# Patient Record
Sex: Female | Born: 1960 | Race: Black or African American | Hispanic: No | Marital: Married
Health system: Southern US, Community
[De-identification: ages and names within clinical notes are randomized; demographics above are authoritative.]

---

## 2001-11-23 ENCOUNTER — Emergency Department (HOSPITAL_COMMUNITY): Admission: EM | Admit: 2001-11-23 | Discharge: 2001-11-23 | Payer: Self-pay | Admitting: Emergency Medicine

## 2003-08-11 ENCOUNTER — Ambulatory Visit (HOSPITAL_COMMUNITY): Admission: RE | Admit: 2003-08-11 | Discharge: 2003-08-11 | Payer: Self-pay | Admitting: General Surgery

## 2003-08-24 ENCOUNTER — Emergency Department (HOSPITAL_COMMUNITY): Admission: EM | Admit: 2003-08-24 | Discharge: 2003-08-24 | Payer: Self-pay | Admitting: *Deleted

## 2003-10-20 ENCOUNTER — Emergency Department (HOSPITAL_COMMUNITY): Admission: EM | Admit: 2003-10-20 | Discharge: 2003-10-20 | Payer: Self-pay | Admitting: Emergency Medicine

## 2006-01-27 ENCOUNTER — Ambulatory Visit: Payer: Self-pay | Admitting: Cardiology

## 2006-01-27 ENCOUNTER — Inpatient Hospital Stay (HOSPITAL_COMMUNITY): Admission: EM | Admit: 2006-01-27 | Discharge: 2006-01-30 | Payer: Self-pay | Admitting: Emergency Medicine

## 2006-02-07 ENCOUNTER — Emergency Department (HOSPITAL_COMMUNITY): Admission: EM | Admit: 2006-02-07 | Discharge: 2006-02-07 | Payer: Self-pay | Admitting: Emergency Medicine

## 2006-03-11 ENCOUNTER — Inpatient Hospital Stay (HOSPITAL_COMMUNITY): Admission: EM | Admit: 2006-03-11 | Discharge: 2006-03-17 | Payer: Self-pay | Admitting: Emergency Medicine

## 2006-03-11 ENCOUNTER — Ambulatory Visit: Payer: Self-pay | Admitting: Cardiology

## 2006-04-04 ENCOUNTER — Emergency Department (HOSPITAL_COMMUNITY): Admission: EM | Admit: 2006-04-04 | Discharge: 2006-04-04 | Payer: Self-pay | Admitting: Emergency Medicine

## 2006-04-05 ENCOUNTER — Ambulatory Visit: Payer: Self-pay | Admitting: Internal Medicine

## 2006-04-05 ENCOUNTER — Inpatient Hospital Stay (HOSPITAL_COMMUNITY): Admission: EM | Admit: 2006-04-05 | Discharge: 2006-04-15 | Payer: Self-pay | Admitting: Emergency Medicine

## 2006-04-05 ENCOUNTER — Emergency Department (HOSPITAL_COMMUNITY): Admission: EM | Admit: 2006-04-05 | Discharge: 2006-04-05 | Payer: Self-pay | Admitting: Emergency Medicine

## 2006-04-07 ENCOUNTER — Ambulatory Visit: Payer: Self-pay | Admitting: Cardiology

## 2006-05-08 ENCOUNTER — Ambulatory Visit: Payer: Self-pay | Admitting: Cardiology

## 2006-05-09 ENCOUNTER — Ambulatory Visit: Payer: Self-pay | Admitting: Internal Medicine

## 2006-05-09 ENCOUNTER — Ambulatory Visit: Payer: Self-pay | Admitting: Endocrinology

## 2006-05-09 ENCOUNTER — Inpatient Hospital Stay (HOSPITAL_COMMUNITY): Admission: EM | Admit: 2006-05-09 | Discharge: 2006-05-26 | Payer: Self-pay | Admitting: Cardiology

## 2006-05-11 ENCOUNTER — Ambulatory Visit: Payer: Self-pay | Admitting: Cardiology

## 2006-05-13 DIAGNOSIS — Z8669 Personal history of other diseases of the nervous system and sense organs: Secondary | ICD-10-CM

## 2006-05-14 DIAGNOSIS — L738 Other specified follicular disorders: Secondary | ICD-10-CM

## 2006-05-16 ENCOUNTER — Ambulatory Visit: Payer: Self-pay | Admitting: Infectious Diseases

## 2006-05-26 ENCOUNTER — Encounter (INDEPENDENT_AMBULATORY_CARE_PROVIDER_SITE_OTHER): Payer: Self-pay | Admitting: *Deleted

## 2006-05-26 DIAGNOSIS — I10 Essential (primary) hypertension: Secondary | ICD-10-CM | POA: Insufficient documentation

## 2006-05-26 DIAGNOSIS — M329 Systemic lupus erythematosus, unspecified: Secondary | ICD-10-CM | POA: Insufficient documentation

## 2006-08-17 ENCOUNTER — Ambulatory Visit: Payer: Self-pay | Admitting: Cardiology

## 2007-05-17 ENCOUNTER — Ambulatory Visit: Payer: Self-pay | Admitting: Cardiology

## 2007-10-08 IMAGING — US US CAROTID DUPLEX BILAT
1 series · 13 of 24 positions shown · non-contrast
Comparison: No prior studies for comparison.

CLINICAL DATA: Bilateral cerebral infarcts.
 BILATERAL CAROTID DUPLEX DOPPLER ULTRASOUND:

[Series 1: unknown · 0.07mm/px · 13 of 38 slices shown]
[im 1/38]
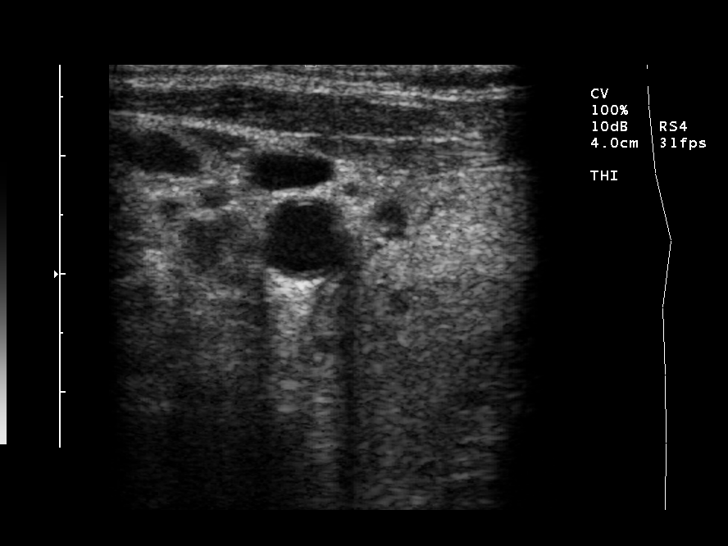
[im 4/38]
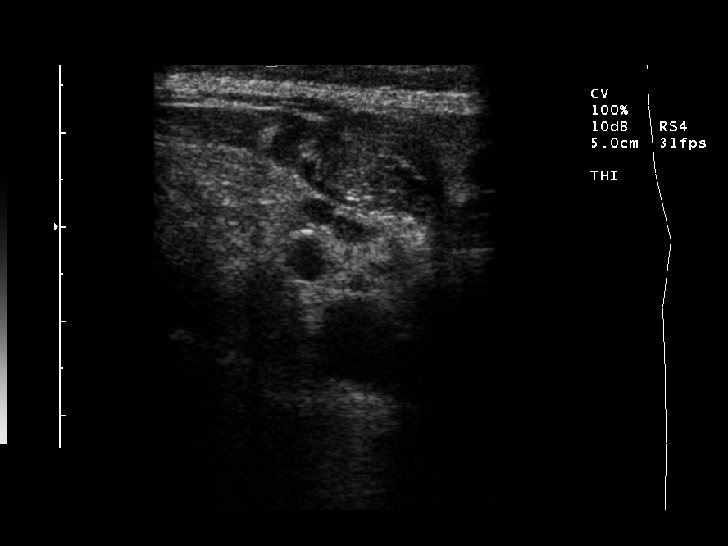
[im 7/38]
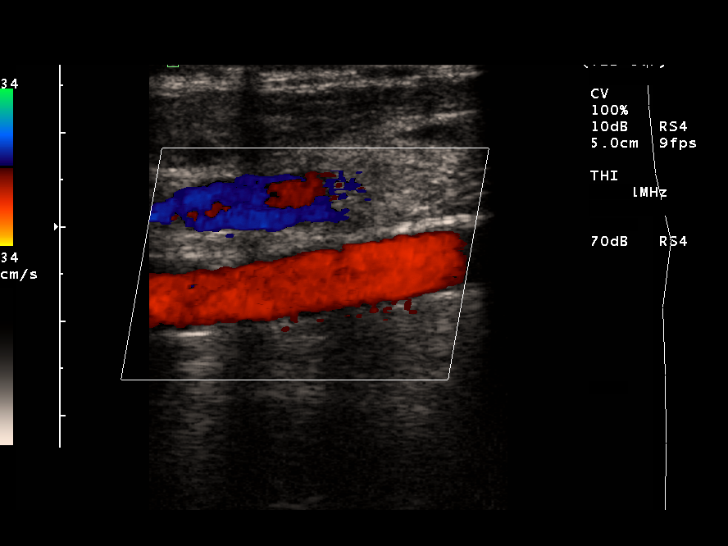
[im 10/38]
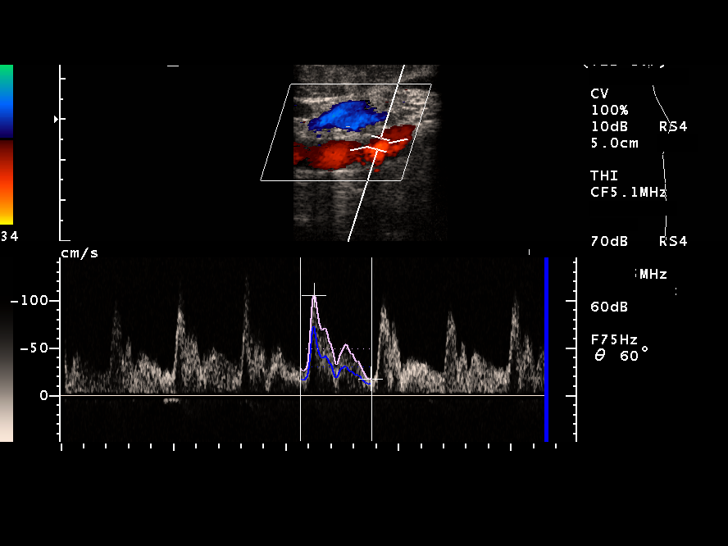
[im 13/38]
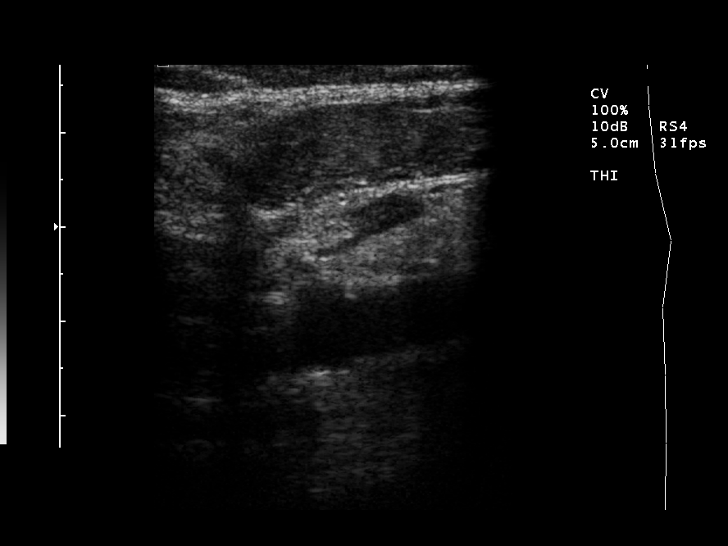
[im 17/38]
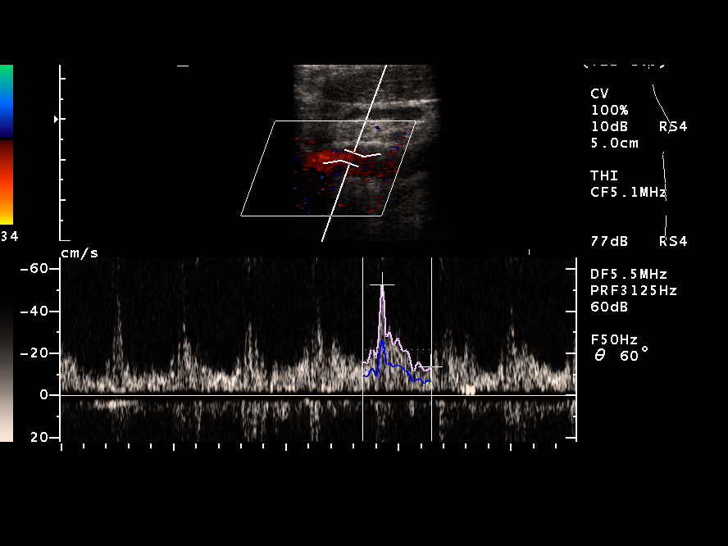
[im 20/38]
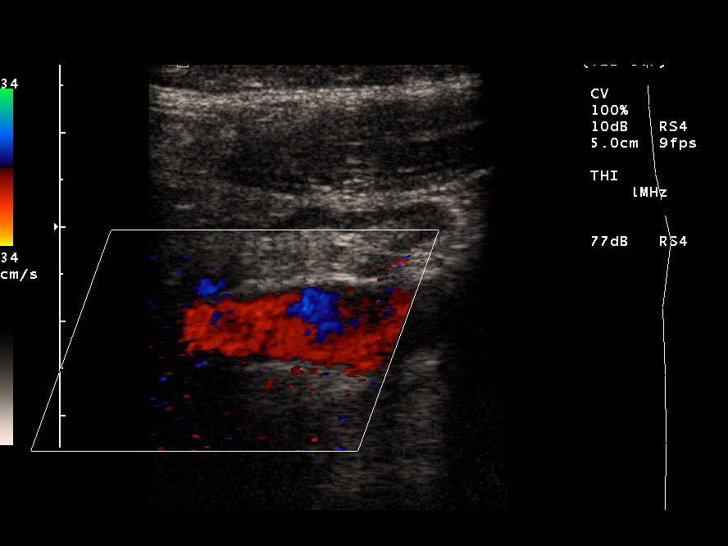
[im 21/38]
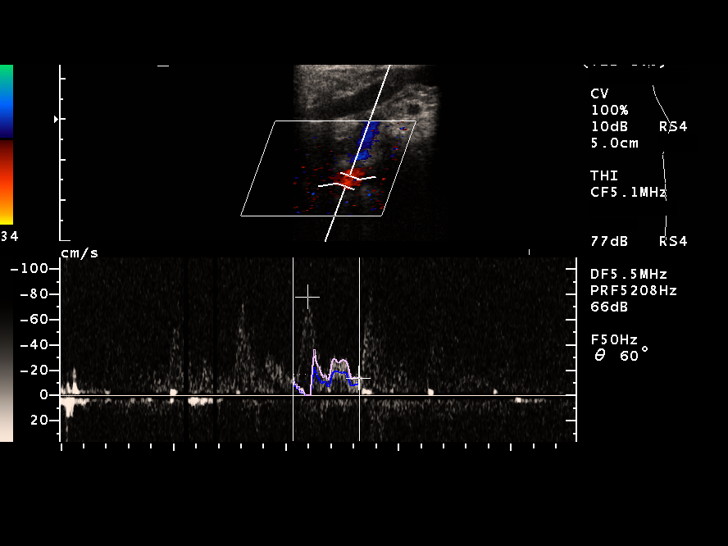
[im 25/38]
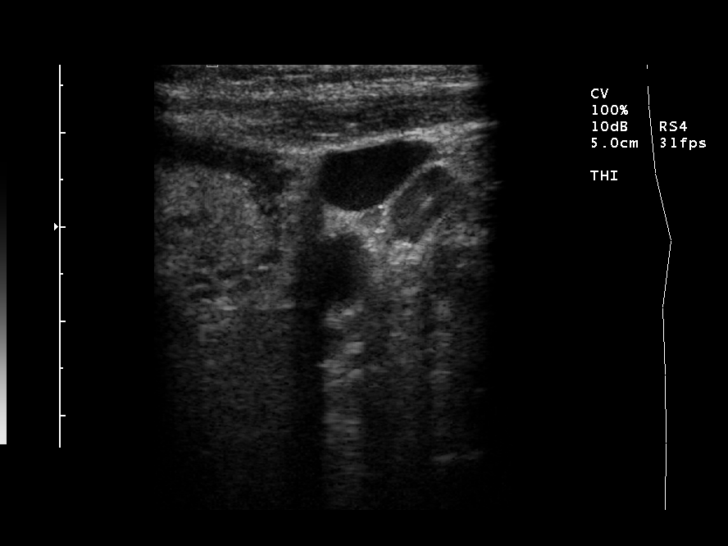
[im 28/38]
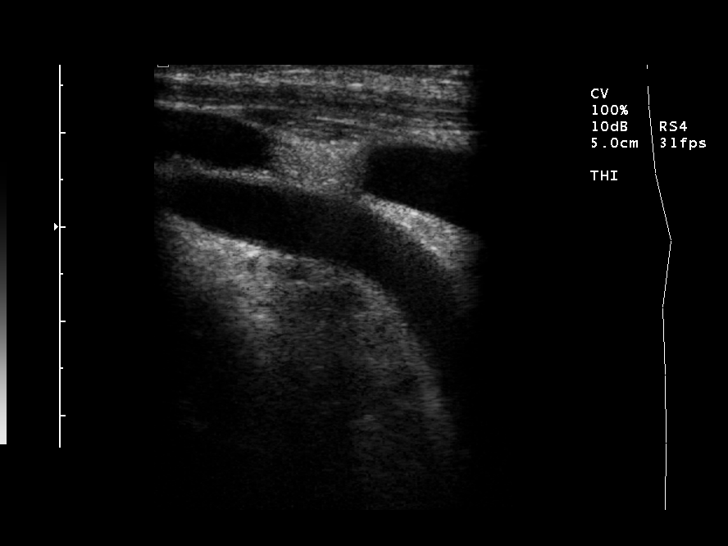
[im 31/38]
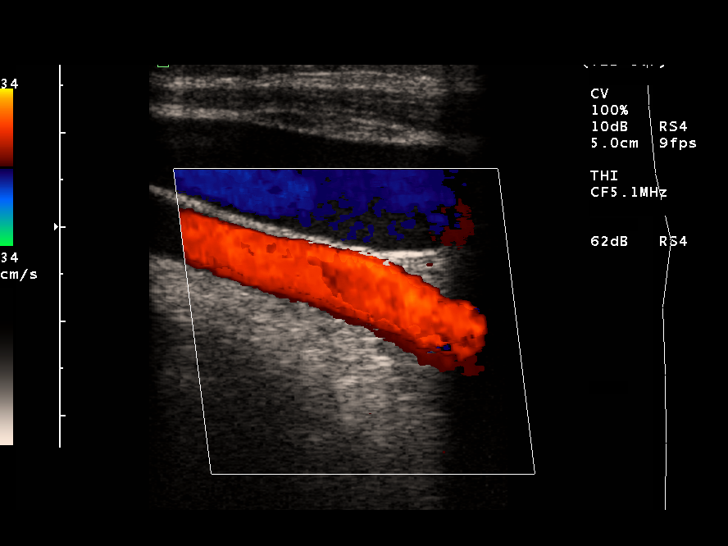
[im 34/38]
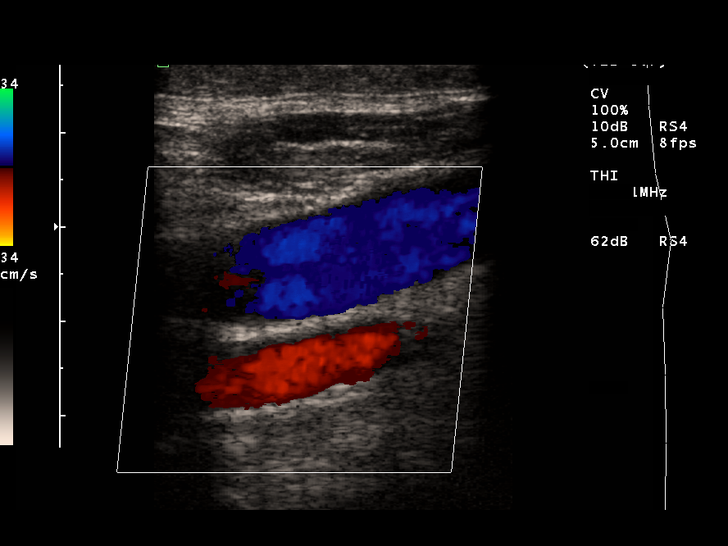
[im 38/38]
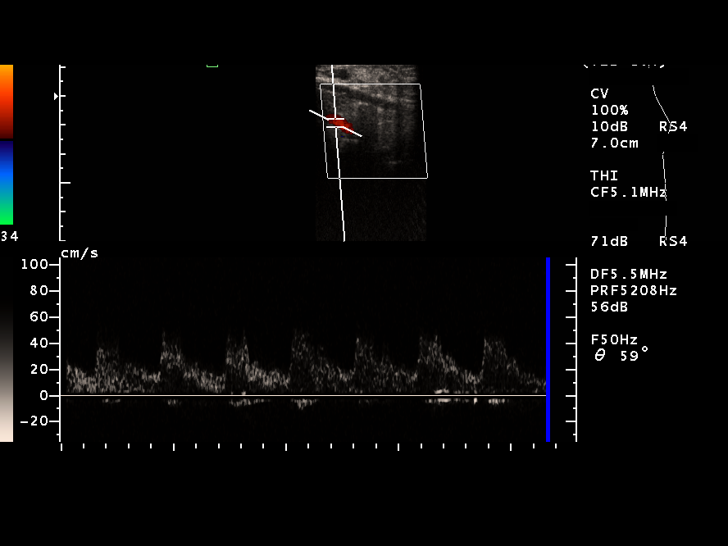

[13 of 24 positions shown; findings below may reference images not displayed]

FINDINGS: The following Doppler flow velocity measurements were obtained (in cm/sec):
 SITE:  PEAK SYSTOLIC  END DIASTOLIC
 RIGHT  ICA:
 RIGHT ECA:
 RIGHT CCA:
 RIGHT ICA/CCA RATIO:

 LEFT ICA:
 LEFT ECA:  
 LEFT CCA:
 LEFT ICA/CCA RATIO:  
 Criteria:  Quantification of carotid stenosis is based on velocity parameters that correlate the residual internal carotid diameter with NASCET-based stenosis levels.
 The right carotid bifurcation shows no significant plaque.  Waveforms and velocities obtained at the level of the common carotid, external carotid, and internal carotid arteries are normal.  Antegrade flow is present in the right vertebral artery.
 The left common carotid artery was sampled demonstrating normal velocity and waveform.  Due to the patient?s body habitus as well as inability to cooperate for the examination, the left internal carotid or external carotid arteries could not be visualized or sampled.  Antegrade flow is present in the left vertebral artery.
IMPRESSION: 1.  No significant right carotid disease in the neck with estimated ICA stenosis of less than 50%.
 2.  Inability to sample or visualize the left carotid bifurcation due to body habitus and patient motion.

## 2007-10-09 IMAGING — CR DG CHEST 1V PORT
1 series · 1 of 1 positions shown · non-contrast
Comparison: none

HISTORY: Dyspnea, productive cough, hypertensive encephalopathy, seizures, lupus

[view not recorded]
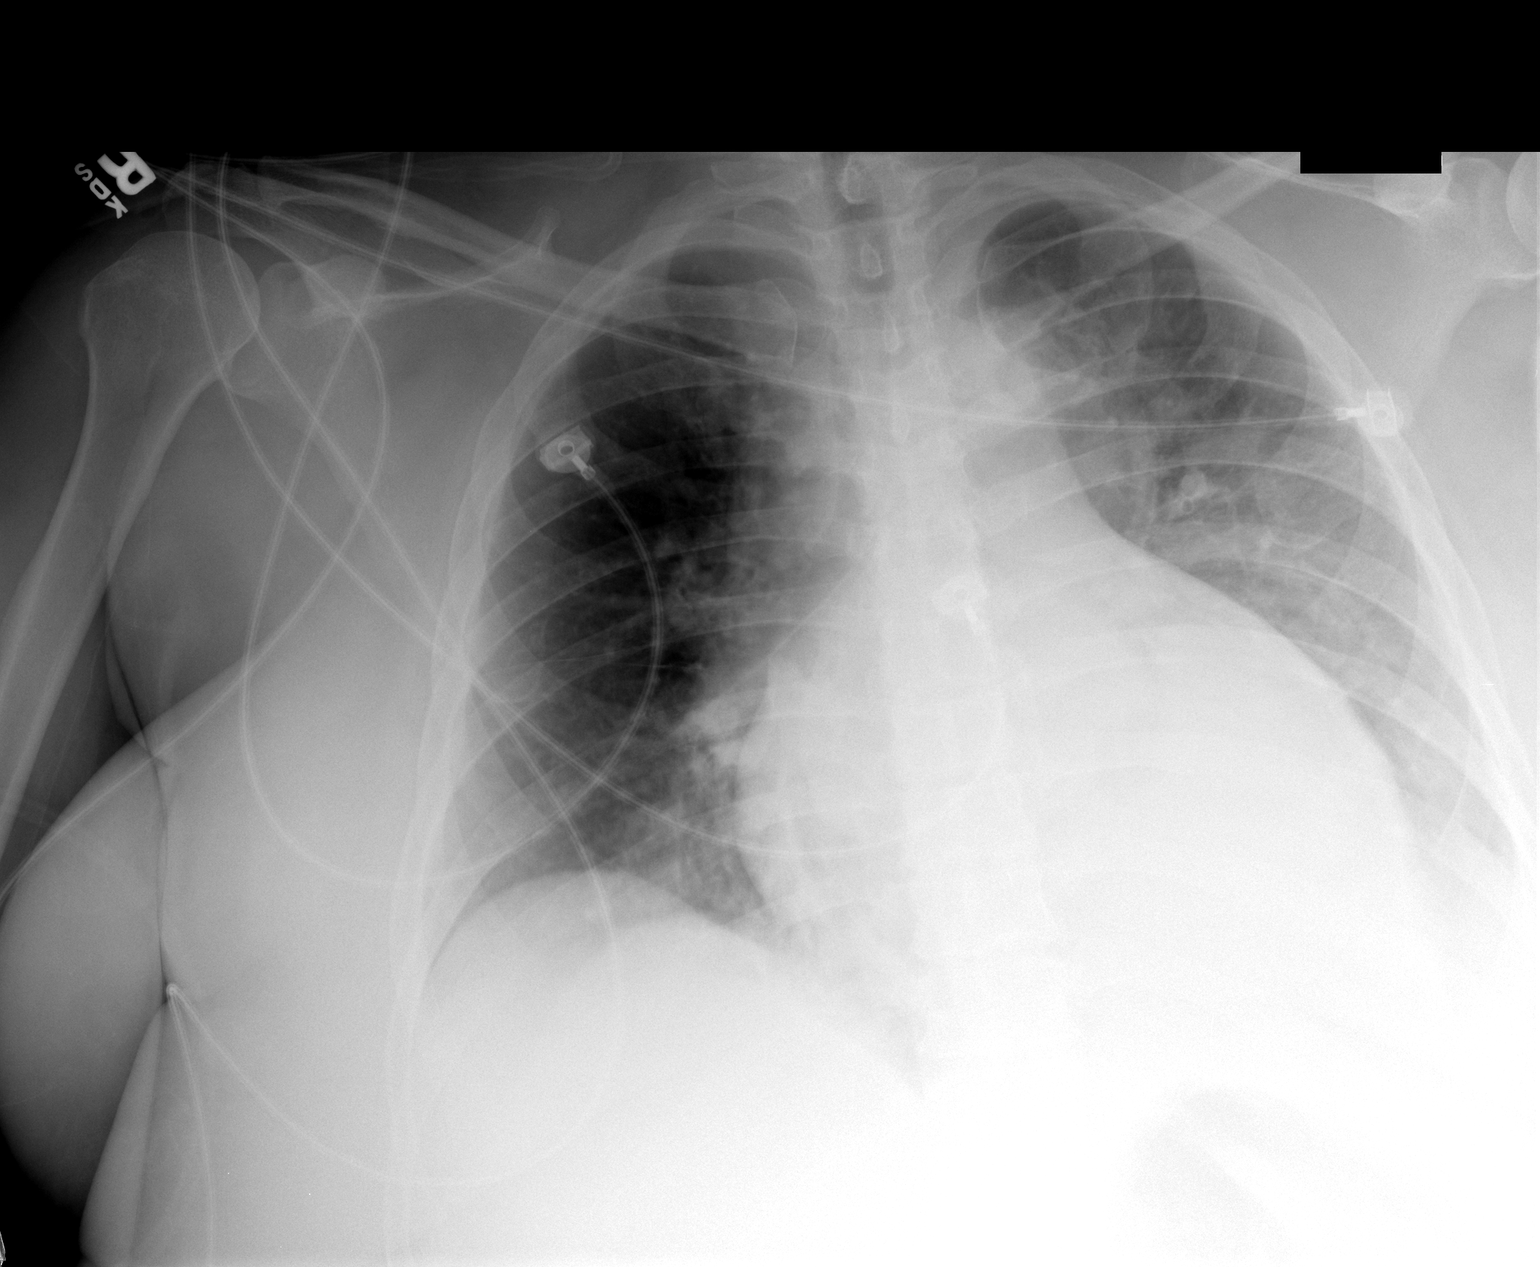

[1 of 1 positions shown; findings below may reference images not displayed]

PORTABLE CHEST ONE VIEW:

Portable exam 2422 hours compared to 03/10/2006

Cardiac enlargement with pulmonary vascular congestion.
Bilateral perihilar infiltrates much greater on left.
This could represent asymmetric edema or infection.
Dense opacification of left lower lobe either representing atelectasis or
consolidation.
Multiple cardiac monitoring lines project over chest.
IMPRESSION: Cardiomegaly.
Left greater than right pulmonary infiltrates, greatest left lower lobe,
question pneumonia though asymmetric edema not excluded.

## 2007-11-20 IMAGING — US US BIOPSY
1 series · 14 of 14 positions shown · non-contrast
Comparison: none

CLINICAL DATA: 45 year-old-female with lupus and previous renal biopsy was inconclusive  
ULTRASOUND GUIDED LEFT KIDNEY BIOPSY ? 05/19/06:
The patient was continuously monitored by the radiology nurse who administered IV medications for conscious sedation. 
Physician:  Dr. Marselo. 
Procedure: Informed consent was obtained.   The left kidney was found to be suitable for percutaneous biopsy.    The patient was placed in a prone position.   The skin was prepped and draped in sterile fashion.  The skin was anesthetized with 1% lidocaine.  
The lower pole of the left kidney was targeted using a 16-gauge core biopsy device.  A total of 4 core biopsies are obtained.  Pathology evaluated the samples and felt that they were adequate. No significant bleeding on the post biopsy images.

[Series 1: unknown · 0.37mm/px · 14 of 14 slices shown]
[im 1/14]
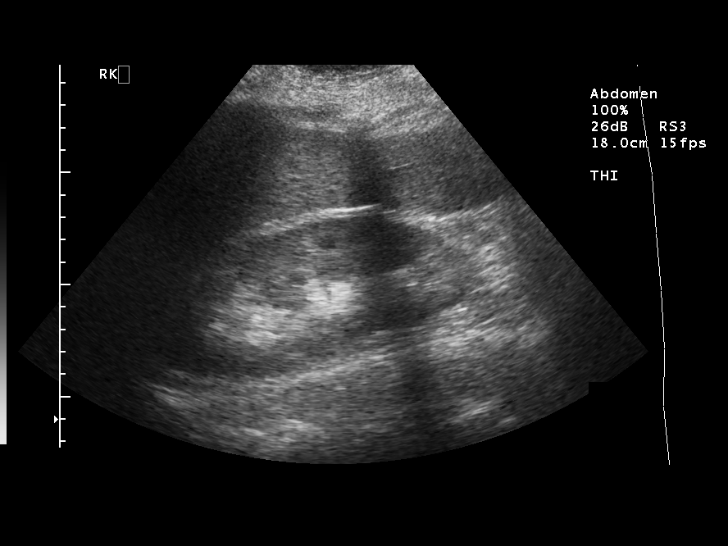
[im 2/14]
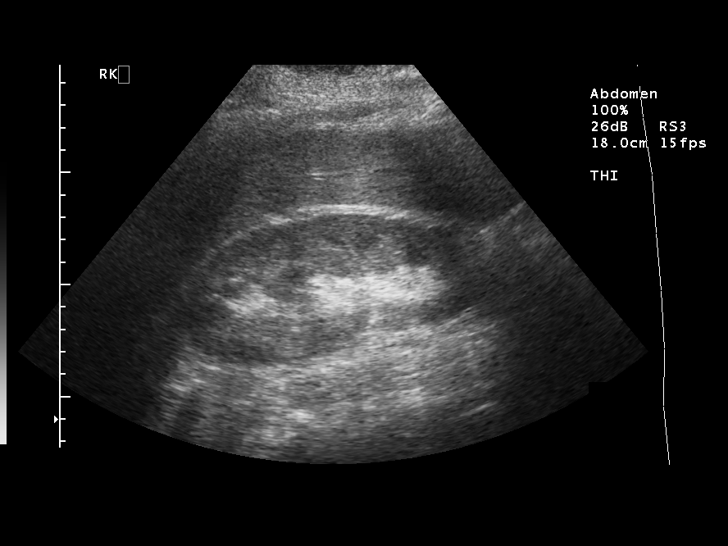
[im 3/14]
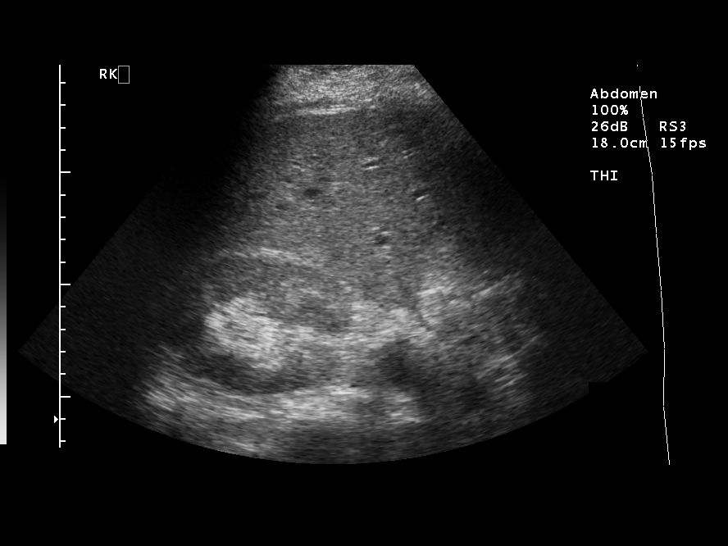
[im 4/14]
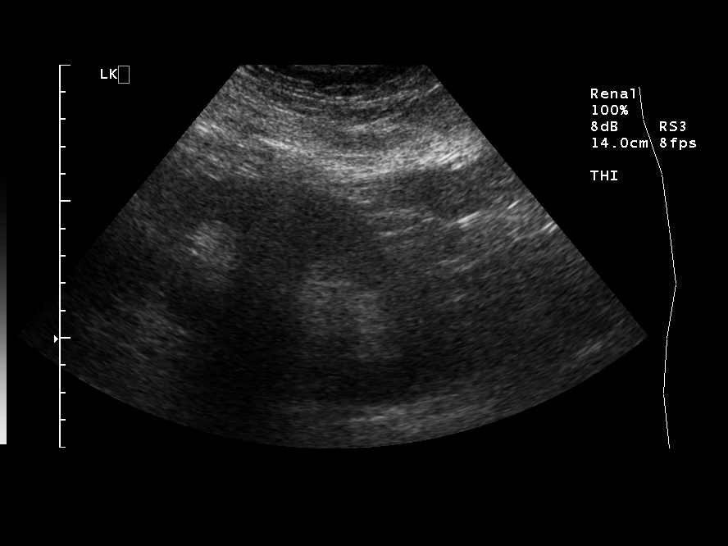
[im 5/14]
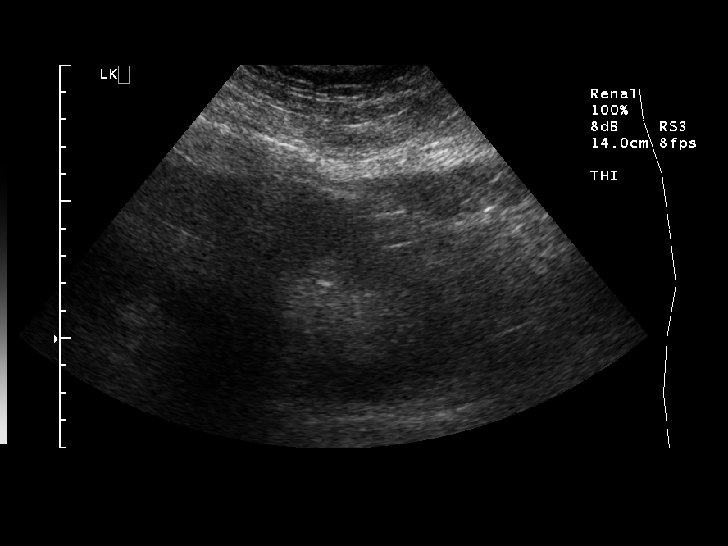
[im 6/14]
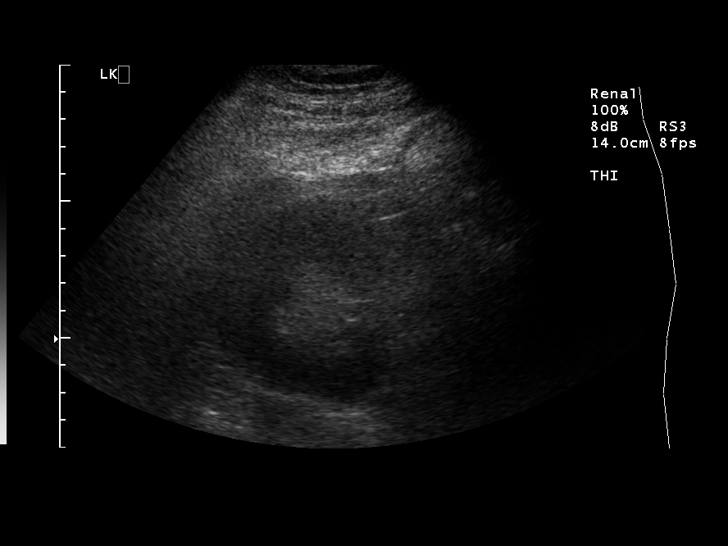
[im 7/14]
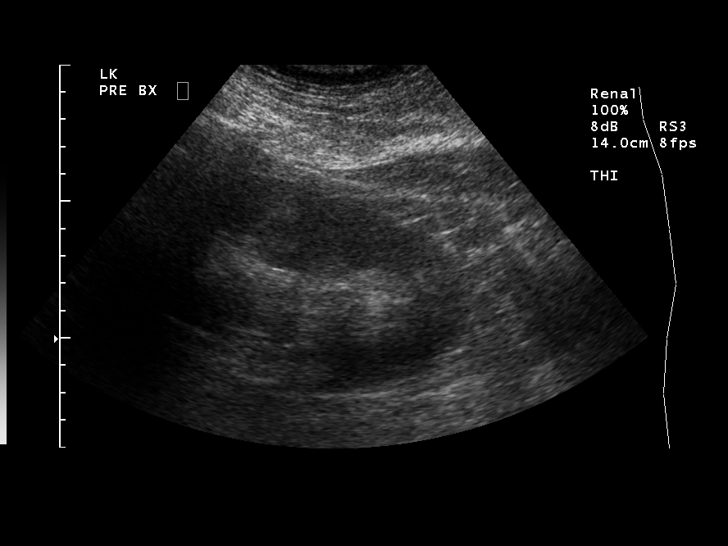
[im 8/14]
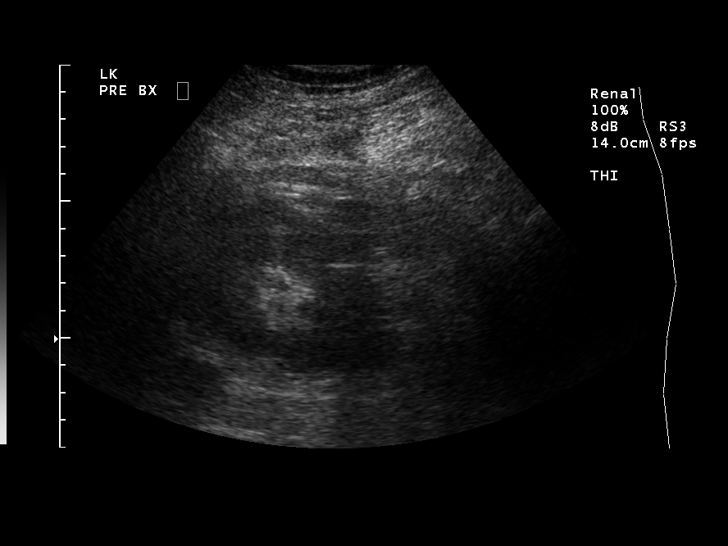
[im 9/14]
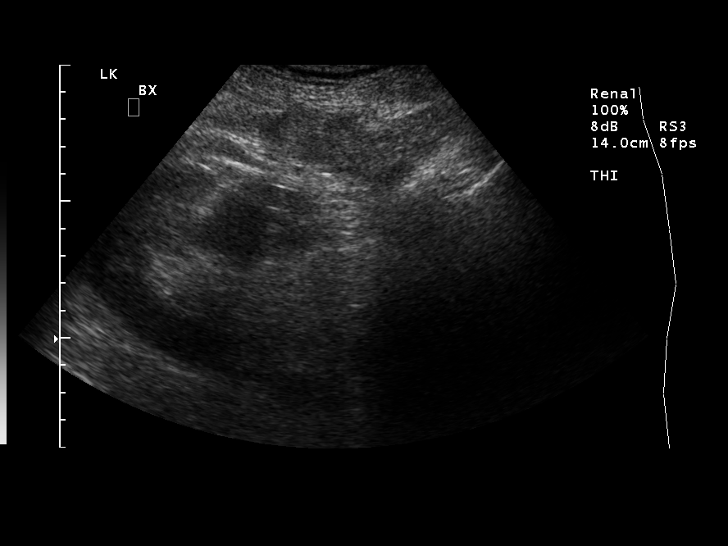
[im 10/14]
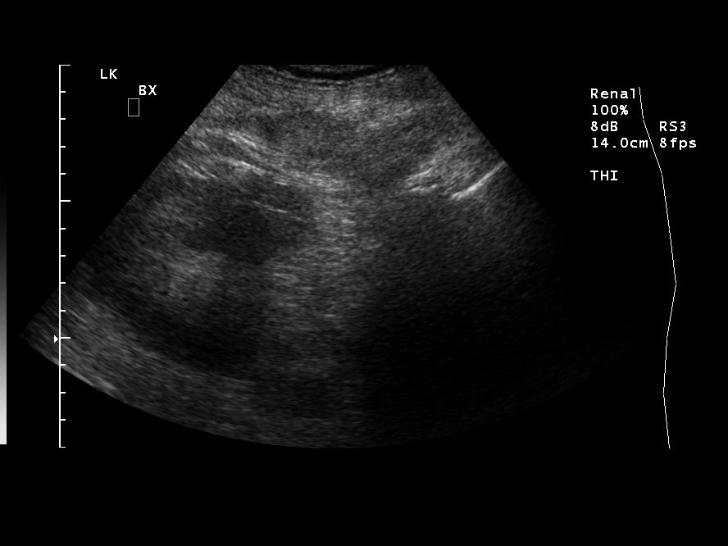
[im 11/14]
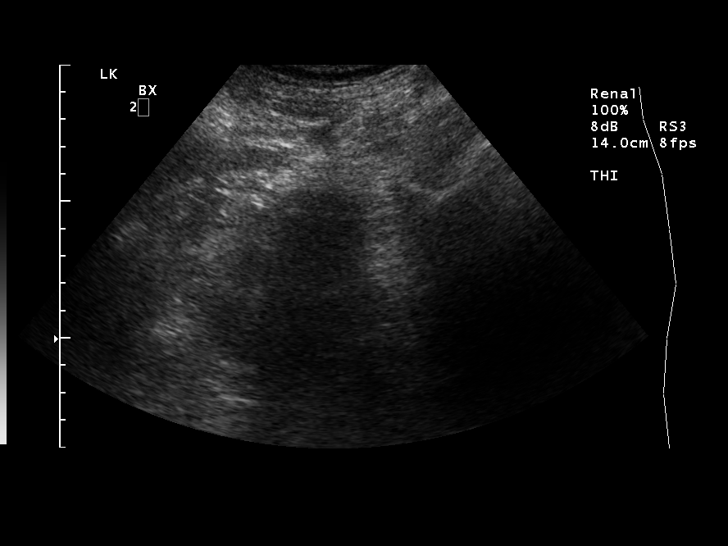
[im 12/14]
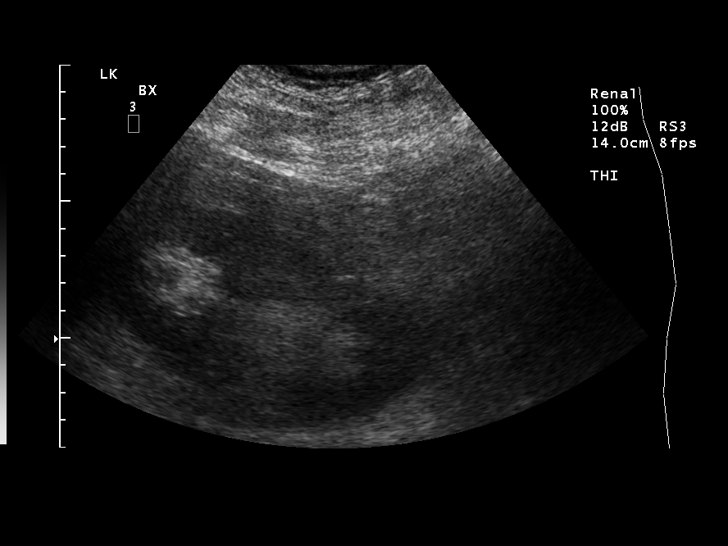
[im 13/14]
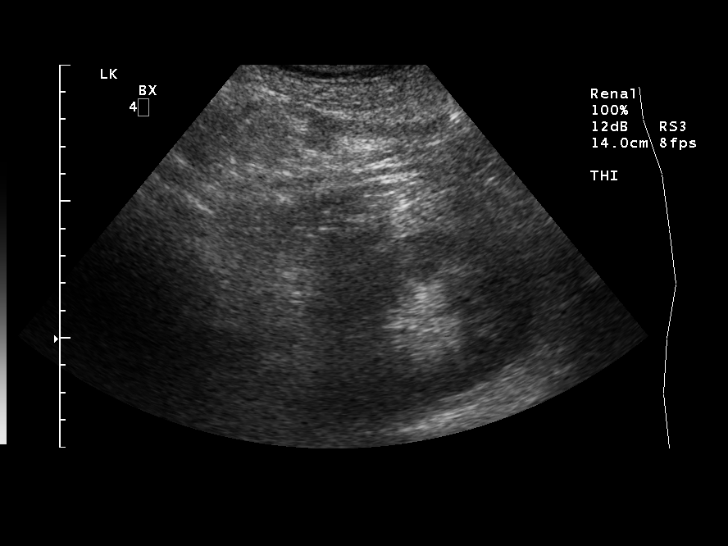
[im 14/14]
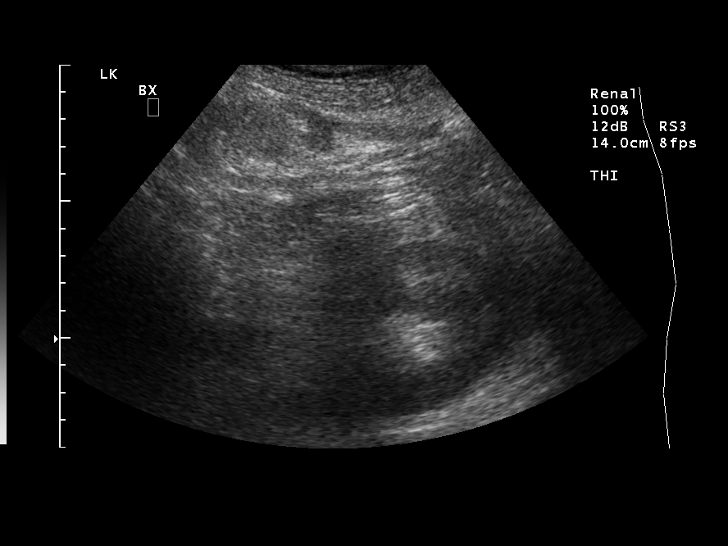

[14 of 14 positions shown; findings below may reference images not displayed]

IMPRESSION: Ultrasound guided core biopsies of left kidney.

## 2017-09-21 ENCOUNTER — Telehealth: Payer: Self-pay

## 2017-09-21 NOTE — Telephone Encounter (Signed)
Opened in error
